# Patient Record
Sex: Female | Born: 1975 | Race: White | Hispanic: No | Marital: Married | Smoking: Never smoker
Health system: Southern US, Community
[De-identification: ages and names within clinical notes are randomized; demographics above are authoritative.]

## PROBLEM LIST (undated history)

## (undated) DIAGNOSIS — N809 Endometriosis, unspecified: Secondary | ICD-10-CM

## (undated) DIAGNOSIS — E079 Disorder of thyroid, unspecified: Secondary | ICD-10-CM

---

## 2015-04-07 DIAGNOSIS — N83201 Unspecified ovarian cyst, right side: Secondary | ICD-10-CM | POA: Insufficient documentation

## 2015-06-24 DIAGNOSIS — N80109 Endometriosis of ovary, unspecified side, unspecified depth: Secondary | ICD-10-CM | POA: Insufficient documentation

## 2015-08-15 DIAGNOSIS — T8149XA Infection following a procedure, other surgical site, initial encounter: Secondary | ICD-10-CM | POA: Insufficient documentation

## 2020-01-18 ENCOUNTER — Other Ambulatory Visit: Payer: Self-pay

## 2020-01-18 ENCOUNTER — Ambulatory Visit
Admission: EM | Admit: 2020-01-18 | Discharge: 2020-01-18 | Disposition: A | Discharge status: Home / Self Care | Payer: BC Managed Care – PPO | Attending: Emergency Medicine | Admitting: Emergency Medicine

## 2020-01-18 DIAGNOSIS — R1011 Right upper quadrant pain: Secondary | ICD-10-CM

## 2020-01-18 HISTORY — DX: Endometriosis, unspecified: N80.9

## 2020-01-18 HISTORY — DX: Disorder of thyroid, unspecified: E07.9

## 2020-01-18 LAB — LIPASE, BLOOD: Lipase: 30 U/L (ref 11–51)

## 2020-01-18 LAB — CBC WITH DIFFERENTIAL/PLATELET
Abs Immature Granulocytes: 0.03 10*3/uL (ref 0.00–0.07)
Basophils Absolute: 0.1 10*3/uL (ref 0.0–0.1)
Basophils Relative: 1 %
Eosinophils Absolute: 0.5 10*3/uL (ref 0.0–0.5)
Eosinophils Relative: 5 %
HCT: 36.8 % (ref 36.0–46.0)
Hemoglobin: 12.9 g/dL (ref 12.0–15.0)
Immature Granulocytes: 0 %
Lymphocytes Relative: 26 %
Lymphs Abs: 2.9 10*3/uL (ref 0.7–4.0)
MCH: 30.9 pg (ref 26.0–34.0)
MCHC: 35.1 g/dL (ref 30.0–36.0)
MCV: 88 fL (ref 80.0–100.0)
Monocytes Absolute: 0.9 10*3/uL (ref 0.1–1.0)
Monocytes Relative: 8 %
Neutro Abs: 6.6 10*3/uL (ref 1.7–7.7)
Neutrophils Relative %: 60 %
Platelets: 287 10*3/uL (ref 150–400)
RBC: 4.18 MIL/uL (ref 3.87–5.11)
RDW: 13 % (ref 11.5–15.5)
WBC: 11 10*3/uL — ABNORMAL HIGH (ref 4.0–10.5)
nRBC: 0 % (ref 0.0–0.2)

## 2020-01-18 LAB — URINALYSIS, COMPLETE (UACMP) WITH MICROSCOPIC
Bacteria, UA: NONE SEEN
Bilirubin Urine: NEGATIVE
Glucose, UA: NEGATIVE mg/dL
Leukocytes,Ua: NEGATIVE
Nitrite: NEGATIVE
Protein, ur: NEGATIVE mg/dL
RBC / HPF: NONE SEEN RBC/hpf (ref 0–5)
Specific Gravity, Urine: 1.01 (ref 1.005–1.030)
WBC, UA: NONE SEEN WBC/hpf (ref 0–5)
pH: 6.5 (ref 5.0–8.0)

## 2020-01-18 LAB — COMPREHENSIVE METABOLIC PANEL
ALT: 19 U/L (ref 0–44)
AST: 20 U/L (ref 15–41)
Albumin: 4.6 g/dL (ref 3.5–5.0)
Alkaline Phosphatase: 68 U/L (ref 38–126)
Anion gap: 13 (ref 5–15)
BUN: 14 mg/dL (ref 6–20)
CO2: 24 mmol/L (ref 22–32)
Calcium: 9.4 mg/dL (ref 8.9–10.3)
Chloride: 100 mmol/L (ref 98–111)
Creatinine, Ser: 0.72 mg/dL (ref 0.44–1.00)
GFR calc Af Amer: >60 mL/min (ref >60)
GFR calc non Af Amer: >60 mL/min (ref >60)
Glucose, Bld: 88 mg/dL (ref 70–99)
Potassium: 4.2 mmol/L (ref 3.5–5.1)
Sodium: 137 mmol/L (ref 135–145)
Total Bilirubin: 0.3 mg/dL (ref 0.3–1.2)
Total Protein: 7.6 g/dL (ref 6.5–8.1)

## 2020-01-18 NOTE — ED Triage Notes (Signed)
Pt presents with c/o RUQ pain since the weekend. She reports the area feels swollen and the pain has started radiating to her back and right flank. She denies any symptoms after eating such as n/v/d. She does report some increased indigestion over the past month.

## 2020-01-18 NOTE — ED Provider Notes (Signed)
[facility] ____________________________________________  Time seen: Approximately 7:31 PM  I have reviewed the triage vital signs and the nursing notes.   HISTORY  Chief Complaint Abdominal Pain (RUQ)  HPI Courtney Garrison is a 44 y.o. female presenting for evaluation of right upper quadrant abdominal pain.  Patient reports for the last week she has had right upper abdominal discomfort that is more of a bloated discomfort feeling.  States yesterday the pain increased more and intermittently feeling it going into her right back. Pain intermittent.  Denies any other pain radiation.  Denies accompanying nausea, vomiting or diarrhea.  Does not seem to be aggravated by food intake.  States that she overall eats a pretty clean diet at baseline.  No accompanying fevers.  Denies dysuria or vaginal complaints.  Further states since end of last summer she has occasionally had some right upper abdominal discomfort in which she thought was acid reflux related, but never had any persistent pain.  Denies cough, congestion, chest pain, shortness of breath or recent sickness.  Denies other aggravating or alleviating factors.  NO PCP  Patient's last menstrual period was 01/01/2020 (approximate). Denies pregnancy.   Has appt with OBGYN on Feb 3 for same complaint.    Past Medical History:  Diagnosis Date  . Endometriosis   . Thyroid disease     There are no problems to display for this patient.   History reviewed. No pertinent surgical history.   No current facility-administered medications for this encounter.  Current Outpatient Medications:  .  calcium-vitamin D (OSCAL WITH D) 500-200 MG-UNIT tablet, Take 1 tablet by mouth., Disp: , Rfl:  .  hydrochlorothiazide (MICROZIDE) 12.5 MG capsule, Take 12.5 mg by mouth daily., Disp: , Rfl:  .  OVER THE COUNTER MEDICATION, IMMUNITY BLEND/vitamins, Disp: , Rfl:  .  SYNTHROID 125 MCG tablet, Take 125 mcg by mouth daily., Disp: , Rfl:    Allergies Patient has no known allergies.  Family History  Problem Relation Age of Onset  . Healthy Mother   . Healthy Father     Social History Social History   Tobacco Use  . Smoking status: Never Smoker  . Smokeless tobacco: Never Used  Substance Use Topics  . Alcohol use: Not Currently  . Drug use: Not on file    Review of Systems Constitutional: No fever ENT: No sore throat. Cardiovascular: Denies chest pain. Respiratory: Denies shortness of breath. Gastrointestinal: Positive abdominal pain.  No nausea, no vomiting.  No diarrhea.  No constipation. LBM today.  Genitourinary: Negative for dysuria. Musculoskeletal: Positive intermittent right back pain. Skin: Negative for rash.   ____________________________________________   PHYSICAL EXAM:  VITAL SIGNS: ED Triage Vitals  Enc Vitals Group     BP 01/18/20 1855 121/85     Pulse Rate 01/18/20 1855 78     Resp -- 18     Temp 01/18/20 1855 98.4 F (36.9 C)     Temp Source 01/18/20 1855 Oral     SpO2 01/18/20 1855 100 %     Weight 01/18/20 1850 168 lb (76.2 kg)     Height 01/18/20 1850 5\' 2"  (1.575 m)     Head Circumference --      Peak Flow --      Pain Score 01/18/20 1849 6     Pain Loc --      Pain Edu? --      Excl. in GC? --     Constitutional: Alert and oriented. Well appearing and in no  acute distress. Eyes: Conjunctivae are normal.  ENT      Head: Normocephalic and atraumatic. Cardiovascular: Normal rate, regular rhythm. Grossly normal heart sounds.  Good peripheral circulation. Respiratory: Normal respiratory effort without tachypnea nor retractions. Breath sounds are clear and equal bilaterally. No wheezes, rales, rhonchi. Gastrointestinal:No distention. Normal Bowel sounds.  Mild to moderate right upper quadrant abdominal tenderness.  Nonguarding.  Abdomen otherwise soft and nontender.  No CVA tenderness. Musculoskeletal: Gait.  No flank tenderness. Neurologic:  Normal speech and language.  Speech is normal. No gait instability.  Skin:  Skin is warm, dry and intact. No rash noted. Psychiatric: Mood and affect are normal. Speech and behavior are normal. Patient exhibits appropriate insight and judgment   ___________________________________________   LABS (all labs ordered are listed, but only abnormal results are displayed)  Labs Reviewed  URINALYSIS, COMPLETE (UACMP) WITH MICROSCOPIC - Abnormal; Notable for the following components:      Result Value   Hgb urine dipstick TRACE (*)    Ketones, ur TRACE (*)    All other components within normal limits  CBC WITH DIFFERENTIAL/PLATELET - Abnormal; Notable for the following components:   WBC 11.0 (*)    All other components within normal limits  COMPREHENSIVE METABOLIC PANEL  LIPASE, BLOOD   ____________________________________________   PROCEDURES Procedures    INITIAL IMPRESSION / ASSESSMENT AND PLAN / ED COURSE  Pertinent labs & imaging results that were available during my care of the patient were reviewed by me and considered in my medical decision making (see chart for details).  Well-appearing patient.  No acute distress.  Right upper quadrant abdominal pain.  Discussed multiple differentials including gallstone, cholecystitis, pancreatitis, esophagitis, GERD.  Suspect gallbladder etiology.  No ultrasound availability at this facility at this time.  Will evaluate CBC, CMP and lipase.  Labs reviewed, as above, overall unremarkable.  Patient does not have a current active PCP.  Follows with her OB/GYN, counseled to follow-up closely as well as information for GI given.  Recommend follow-up with GI.  Discussed worsening symptoms proceed directly to ER. Discussed follow up and return parameters including no resolution or any worsening concerns. Patient verbalized understanding and agreed to plan.   ____________________________________________   FINAL CLINICAL IMPRESSION(S) / ED DIAGNOSES  Final diagnoses:  Right  upper quadrant abdominal pain     ED Discharge Orders    None       Note: This dictation was prepared with Dragon dictation along with smaller phrase technology. Any transcriptional errors that result from this process are unintentional.         Marylene Land, NP 01/18/20 2012

## 2020-01-18 NOTE — Discharge Instructions (Signed)
Gallbladder diet as discussed. Monitor.   Follow-up with your OB/GYN and gastroenterology as discussed.  Return to urgent care as needed.  Proceed erectly to emergency room for increased pain or worsening complaints.

## 2020-01-23 ENCOUNTER — Other Ambulatory Visit: Payer: Self-pay

## 2020-01-23 DIAGNOSIS — N879 Dysplasia of cervix uteri, unspecified: Secondary | ICD-10-CM | POA: Insufficient documentation

## 2020-01-24 ENCOUNTER — Other Ambulatory Visit: Payer: Self-pay

## 2020-01-24 ENCOUNTER — Encounter: Payer: Self-pay | Admitting: Gastroenterology

## 2020-01-24 ENCOUNTER — Ambulatory Visit (INDEPENDENT_AMBULATORY_CARE_PROVIDER_SITE_OTHER): Payer: BC Managed Care – PPO | Admitting: Gastroenterology

## 2020-01-24 VITALS — BP 115/78 | HR 79 | Temp 98.6°F | Ht 62.0 in | Wt 176.0 lb

## 2020-01-24 DIAGNOSIS — R1011 Right upper quadrant pain: Secondary | ICD-10-CM | POA: Diagnosis not present

## 2020-01-24 NOTE — Progress Notes (Signed)
Gastroenterology Consultation  Referring Provider:     No ref. provider found Primary Care Physician:  Glendon Axe, MD Primary Gastroenterologist:  Dr. Allen Norris     Reason for Consultation:     Right side abdominal pain        HPI:   Courtney Garrison is a 44 y.o. y/o female referred for consultation & management of right-sided abdominal pain by Dr. Glendon Axe, MD.  This patient comes in today after being seen in urgent care.  The patient was told in urgent care that she may be having right upper quadrant pain caused by a bad gallbladder.  The patient states that she has family members with gallbladder disease and has had their gallbladder out.  The patient denies that any certain foods will make her symptoms any better or worse.  She also denies any nausea or vomiting associated with the right side abdominal pain.  On further discussing the patient's symptoms she states that she was doing a lot of rowing on a rowing machine when she first noticed the pain to occur.  There is no abnormal liver enzymes when she was seen in urgent care.  She was sent to me for evaluation for possible gallbladder disease.  There is no report of any black stools or bloody stools.  She also denies any unexplained weight loss.  Past Medical History:  Diagnosis Date  . Endometriosis   . Thyroid disease     History reviewed. No pertinent surgical history.  Prior to Admission medications   Medication Sig Start Date End Date Taking? Authorizing Provider  calcium-vitamin D (OSCAL WITH D) 500-200 MG-UNIT tablet Take 1 tablet by mouth.   Yes [provider]  hydrochlorothiazide (MICROZIDE) 12.5 MG capsule Take 12.5 mg by mouth daily. 09/27/19  Yes [provider]  OVER THE COUNTER MEDICATION IMMUNITY BLEND/vitamins   Yes [provider]  SYNTHROID 125 MCG tablet Take 125 mcg by mouth daily. 10/31/19  Yes [provider]  valACYclovir (VALTREX) 500 MG tablet TAKE 1 TABLET (500 MG  TOTAL) BY MOUTH 2 (TWO) TIMES DAILY FOR 3 DAYS FOR OUTBREAKS. 09/15/19  Yes [provider]    Family History  Problem Relation Age of Onset  . Healthy Mother   . Healthy Father      Social History   Tobacco Use  . Smoking status: Never Smoker  . Smokeless tobacco: Never Used  Substance Use Topics  . Alcohol use: Not Currently  . Drug use: Not on file    Allergies as of 01/24/2020  . (No Known Allergies)    Review of Systems:    All systems reviewed and negative except where noted in HPI.   Physical Exam:  BP 115/78   Pulse 79   Temp 98.6 F (37 C) (Temporal)   Ht 5\' 2"  (1.575 m)   Wt 176 lb (79.8 kg)   LMP 01/01/2020 (Approximate)   BMI 32.19 kg/m  Patient's last menstrual period was 01/01/2020 (approximate). General:   Alert,  Well-developed, well-nourished, pleasant and cooperative in NAD Head:  Normocephalic and atraumatic. Eyes:  Sclera clear, no icterus.   Conjunctiva pink. Ears:  Normal auditory acuity. Neck:  Supple; no masses or thyromegaly. Lungs:  Respirations even and unlabored.  Clear throughout to auscultation.   No wheezes, crackles, or rhonchi. No acute distress. Heart:  Regular rate and rhythm; no murmurs, clicks, rubs, or gallops. Abdomen:  Normal bowel sounds.  No bruits.  Soft, mild right upper quadrant tenderness  to deep palpation.  Mild non-distended without masses, hepatosplenomegaly or hernias noted.  No guarding or rebound tenderness.  Negative Carnett sign.   Rectal:  Deferred.  Pulses:  Normal pulses noted. Extremities:  No clubbing or edema.  No cyanosis. Neurologic:  Alert and oriented x3;  grossly normal neurologically. Skin:  Intact without significant lesions or rashes.  No jaundice. Lymph Nodes:  No significant cervical adenopathy. Psych:  Alert and cooperative. Normal mood and affect.  Imaging Studies: No results found.  Assessment and Plan:   Courtney Garrison is a 44 y.o. y/o female who comes in today with a history of  right upper quadrant pain.  She states it radiates to her back but not associated with any greasy food or fatty food.  The patient also denies any nausea vomiting fevers or chills.  The patient right upper quadrant pain may be musculoskeletal versus gallbladder disease.  The patient risk factors for gallbladder disease include her age her BMI and her gender.  The patient will be set up for a right upper quadrant ultrasound with a gallbladder emptying study.  The patient has been explained the plan and agrees with it.    Midge Minium, MD. Clementeen Graham    Note: This dictation was prepared with Dragon dictation along with smaller phrase technology. Any transcriptional errors that result from this process are unintentional.

## 2020-01-26 ENCOUNTER — Other Ambulatory Visit: Payer: Self-pay

## 2020-01-26 DIAGNOSIS — R1011 Right upper quadrant pain: Secondary | ICD-10-CM

## 2020-02-06 ENCOUNTER — Ambulatory Visit: Payer: BC Managed Care – PPO

## 2020-02-06 ENCOUNTER — Other Ambulatory Visit: Payer: BC Managed Care – PPO

## 2020-02-09 ENCOUNTER — Ambulatory Visit
Admission: RE | Admit: 2020-02-09 | Discharge: 2020-02-09 | Disposition: A | Discharge status: Home / Self Care | Payer: BC Managed Care – PPO | Source: Ambulatory Visit | Attending: Gastroenterology | Admitting: Gastroenterology

## 2020-02-09 ENCOUNTER — Ambulatory Visit: Payer: BC Managed Care – PPO | Admitting: Gastroenterology

## 2020-02-09 ENCOUNTER — Other Ambulatory Visit: Payer: Self-pay

## 2020-02-09 ENCOUNTER — Encounter
Admission: RE | Admit: 2020-02-09 | Discharge: 2020-02-09 | Disposition: A | Discharge status: Home / Self Care | Payer: BC Managed Care – PPO | Source: Ambulatory Visit | Attending: Gastroenterology | Admitting: Gastroenterology

## 2020-02-09 DIAGNOSIS — R1011 Right upper quadrant pain: Secondary | ICD-10-CM | POA: Insufficient documentation

## 2020-02-09 MED ORDER — TECHNETIUM TC 99M MEBROFENIN IV KIT
5.0000 | PACK | Freq: Once | INTRAVENOUS | Status: AC | PRN
  Administered 2020-02-09: 5.2 via INTRAVENOUS

## 2020-02-12 ENCOUNTER — Telehealth: Payer: Self-pay

## 2020-02-12 NOTE — Telephone Encounter (Signed)
Pt notified of results via mychart.  

## 2020-02-12 NOTE — Telephone Encounter (Signed)
-----   Message from Midge Minium, MD sent at 02/10/2020  9:48 AM EST ----- Let the patient know that I am glad to inform her that her gallbladder emptying study showed her gallbladder is ejecting its contents in a normal fashion and her study was negative for any gallbladder disease.

## 2020-02-12 NOTE — Telephone Encounter (Signed)
-----   Message from Midge Minium, MD sent at 02/10/2020  9:43 AM EST ----- Let the patient know that the abdominal ultrasound did not show any gallstones or any other issues.

## 2020-02-28 ENCOUNTER — Other Ambulatory Visit
Admission: RE | Admit: 2020-02-28 | Discharge: 2020-02-28 | Disposition: A | Discharge status: Home / Self Care | Payer: BC Managed Care – PPO | Source: Ambulatory Visit | Attending: Gastroenterology | Admitting: Gastroenterology

## 2020-02-28 ENCOUNTER — Other Ambulatory Visit: Payer: Self-pay

## 2020-02-28 DIAGNOSIS — Z01812 Encounter for preprocedural laboratory examination: Secondary | ICD-10-CM | POA: Insufficient documentation

## 2020-02-28 DIAGNOSIS — Z20822 Contact with and (suspected) exposure to covid-19: Secondary | ICD-10-CM | POA: Insufficient documentation

## 2020-02-28 LAB — SARS CORONAVIRUS 2 (TAT 6-24 HRS): SARS Coronavirus 2: NEGATIVE

## 2020-02-29 ENCOUNTER — Encounter: Payer: Self-pay | Admitting: Gastroenterology

## 2020-03-01 ENCOUNTER — Ambulatory Visit: Payer: BC Managed Care – PPO | Admitting: Registered Nurse

## 2020-03-01 ENCOUNTER — Encounter
Admission: RE | Disposition: A | Discharge status: Home / Self Care | Payer: Self-pay | Source: Home / Self Care | Attending: Gastroenterology

## 2020-03-01 ENCOUNTER — Ambulatory Visit
Admission: RE | Admit: 2020-03-01 | Discharge: 2020-03-01 | Disposition: A | Discharge status: Home / Self Care | Payer: BC Managed Care – PPO | Attending: Gastroenterology | Admitting: Gastroenterology

## 2020-03-01 ENCOUNTER — Other Ambulatory Visit: Payer: Self-pay

## 2020-03-01 DIAGNOSIS — K222 Esophageal obstruction: Secondary | ICD-10-CM

## 2020-03-01 DIAGNOSIS — R1319 Other dysphagia: Secondary | ICD-10-CM

## 2020-03-01 DIAGNOSIS — E079 Disorder of thyroid, unspecified: Secondary | ICD-10-CM | POA: Insufficient documentation

## 2020-03-01 DIAGNOSIS — R1310 Dysphagia, unspecified: Secondary | ICD-10-CM

## 2020-03-01 DIAGNOSIS — K228 Other specified diseases of esophagus: Secondary | ICD-10-CM | POA: Insufficient documentation

## 2020-03-01 DIAGNOSIS — R1011 Right upper quadrant pain: Secondary | ICD-10-CM | POA: Diagnosis not present

## 2020-03-01 DIAGNOSIS — Z79899 Other long term (current) drug therapy: Secondary | ICD-10-CM | POA: Diagnosis not present

## 2020-03-01 DIAGNOSIS — K449 Diaphragmatic hernia without obstruction or gangrene: Secondary | ICD-10-CM | POA: Diagnosis not present

## 2020-03-01 HISTORY — PX: ESOPHAGOGASTRODUODENOSCOPY (EGD) WITH PROPOFOL: SHX5813

## 2020-03-01 LAB — POCT PREGNANCY, URINE: Preg Test, Ur: NEGATIVE

## 2020-03-01 SURGERY — ESOPHAGOGASTRODUODENOSCOPY (EGD) WITH PROPOFOL
Anesthesia: General

## 2020-03-01 MED ORDER — LIDOCAINE HCL (PF) 2 % IJ SOLN
INTRAMUSCULAR | Status: AC
  Filled 2020-03-01: qty 10

## 2020-03-01 MED ORDER — SODIUM CHLORIDE 0.9 % IV SOLN
INTRAVENOUS | Status: DC
  Administered 2020-03-01: 07:00:00 1000 mL via INTRAVENOUS

## 2020-03-01 MED ORDER — PROPOFOL 10 MG/ML IV BOLUS
INTRAVENOUS | Status: DC | PRN
  Administered 2020-03-01: 30 mg via INTRAVENOUS
  Administered 2020-03-01: 40 mg via INTRAVENOUS
  Administered 2020-03-01: 100 mg via INTRAVENOUS

## 2020-03-01 MED ORDER — EPHEDRINE SULFATE 50 MG/ML IJ SOLN
INTRAMUSCULAR | Status: AC
  Filled 2020-03-01: qty 1

## 2020-03-01 MED ORDER — LIDOCAINE HCL (CARDIAC) PF 100 MG/5ML IV SOSY
PREFILLED_SYRINGE | INTRAVENOUS | Status: DC | PRN
  Administered 2020-03-01: 100 mg via INTRAVENOUS

## 2020-03-01 MED ORDER — PROPOFOL 10 MG/ML IV BOLUS
INTRAVENOUS | Status: AC
  Filled 2020-03-01: qty 20

## 2020-03-01 MED ORDER — PROPOFOL 500 MG/50ML IV EMUL
INTRAVENOUS | Status: AC
  Filled 2020-03-01: qty 200

## 2020-03-01 NOTE — Transfer of Care (Signed)
Immediate Anesthesia Transfer of Care Note  Patient: Courtney Garrison  Procedure(s) Performed: ESOPHAGOGASTRODUODENOSCOPY (EGD) WITH PROPOFOL (N/A )  Patient Location: PACU  Anesthesia Type:General  Level of Consciousness: sedated  Airway & Oxygen Therapy: Patient Spontanous Breathing  Post-op Assessment: Report given to RN and Post -op Vital signs reviewed and stable  Post vital signs: Reviewed and stable  Last Vitals:  Vitals Value Taken Time  BP 99/64 03/01/20 0819  Temp    Pulse 84 03/01/20 0822  Resp 13 03/01/20 0822  SpO2 98 % 03/01/20 0822  Vitals shown include unvalidated device data.  Last Pain:  Vitals:   03/01/20 0818  TempSrc: Temporal  PainSc: 0-No pain         Complications: No apparent anesthesia complications

## 2020-03-01 NOTE — Anesthesia Preprocedure Evaluation (Signed)
Anesthesia Evaluation  Patient identified by MRN, date of birth, ID band Patient awake    Reviewed: Allergy & Precautions, H&P , NPO status , Patient's Chart, lab work & pertinent test results, reviewed documented beta blocker date and time   Airway Mallampati: II   Neck ROM: full    Dental  (+) Poor Dentition   Pulmonary neg pulmonary ROS,    Pulmonary exam normal        Cardiovascular Exercise Tolerance: Good negative cardio ROS Normal cardiovascular exam Rhythm:regular Rate:Normal     Neuro/Psych negative neurological ROS  negative psych ROS   GI/Hepatic negative GI ROS, Neg liver ROS,   Endo/Other  negative endocrine ROS  Renal/GU negative Renal ROS  negative genitourinary   Musculoskeletal   Abdominal   Peds  Hematology negative hematology ROS (+)   Anesthesia Other Findings Past Medical History: No date: Endometriosis No date: Thyroid disease History reviewed. No pertinent surgical history. BMI    Body Mass Index: 32.12 kg/m     Reproductive/Obstetrics negative OB ROS                             Anesthesia Physical Anesthesia Plan  ASA: II  Anesthesia Plan: General   Post-op Pain Management:    Induction:   PONV Risk Score and Plan:   Airway Management Planned:   Additional Equipment:   Intra-op Plan:   Post-operative Plan:   Informed Consent: I have reviewed the patients History and Physical, chart, labs and discussed the procedure including the risks, benefits and alternatives for the proposed anesthesia with the patient or authorized representative who has indicated his/her understanding and acceptance.     Dental Advisory Given  Plan Discussed with: CRNA  Anesthesia Plan Comments:         Anesthesia Quick Evaluation

## 2020-03-01 NOTE — Op Note (Addendum)
[facility] Gastroenterology Patient Name: Courtney Garrison Procedure Date: 03/01/2020 8:03 AM MRN: [id number] Account #: [id number] Date of Birth: 1976/08/05 Admit Type: Outpatient Age: 44 Room: [facility] ENDO ROOM 4 Gender: Female Note Status: Finalized Procedure:             Upper GI endoscopy Indications:           Dysphagia Providers:             Midge Minium MD, MD Referring MD:          Erma Heritage. Maclaurin (Referring MD) Medicines:             Propofol per Anesthesia Complications:         Minor bleeding - stopped spontaneously Procedure:             Pre-Anesthesia Assessment:                        - Prior to the procedure, a History and Physical was                         performed, and patient medications and allergies were                         reviewed. The patient's tolerance of previous                         anesthesia was also reviewed. The risks and benefits                         of the procedure and the sedation options and risks                         were discussed with the patient. All questions were                         answered, and informed consent was obtained. Prior                         Anticoagulants: The patient has taken no previous                         anticoagulant or antiplatelet agents. ASA Grade                         Assessment: II - A patient with mild systemic disease.                         After reviewing the risks and benefits, the patient                         was deemed in satisfactory condition to undergo the                         procedure.                        After obtaining informed consent, the endoscope was  passed under direct vision. Throughout the procedure,                         the patient's blood pressure, pulse, and oxygen                         saturations were monitored continuously. The Endoscope                         was introduced through the mouth, and advanced to  the                         second part of duodenum. The upper GI endoscopy was                         accomplished without difficulty. The patient tolerated                         the procedure well. Findings:      A small hiatal hernia was present.      One benign-appearing, intrinsic mild stenosis was found in the distal       esophagus. The stenosis was traversed. A TTS dilator was passed through       the scope. Dilation with a 15-16.5-18 mm balloon dilator was performed       to 18 mm. The dilation site was examined following endoscope reinsertion       and showed complete resolution of luminal narrowing.      The stomach was normal.      The examined duodenum was normal.      Two biopsies were obtained with cold forceps for histology in the middle       third of the esophagus. Impression:            - Small hiatal hernia.                        - Benign-appearing esophageal stenosis. Dilated.                        - Normal stomach.                        - Normal examined duodenum.                        - Biopsy performed in the middle third of the                         esophagus. Recommendation:        - Discharge patient to home.                        - Clear liquid diet today. Procedure Code(s):     --- Professional ---                        [phone], Esophagogastroduodenoscopy, flexible,                         transoral; with transendoscopic balloon dilation of  esophagus (less than 30 mm diameter)                        43239, 59, Esophagogastroduodenoscopy, flexible,                         transoral; with biopsy, single or multiple Diagnosis Code(s):     --- Professional ---                        R13.10, Dysphagia, unspecified                        K22.2, Esophageal obstruction CPT copyright 2019 American Medical Association. All rights reserved. The codes documented in this report are preliminary and upon coder review may  be revised to  meet current compliance requirements. Midge Minium MD, MD 03/01/2020 8:16:55 AM This report has been signed electronically. Number of Addenda: 0 Note Initiated On: 03/01/2020 8:03 AM Estimated Blood Loss:  Estimated blood loss was minimal.      [facility]

## 2020-03-01 NOTE — H&P (Signed)
Midge Minium, MD [facility] [address], [address] [place], [place] [address] Phone:[phone] Fax : [phone]  Primary Care Physician:  Caleen Essex, MD Primary Gastroenterologist:  Dr. Servando Snare  Pre-Procedure History & Physical: HPI:  Courtney Garrison is a 44 y.o. female is here for an endoscopy.   Past Medical History:  Diagnosis Date  . Endometriosis   . Thyroid disease     History reviewed. No pertinent surgical history.  Prior to Admission medications   Medication Sig Start Date End Date Taking? Authorizing Provider  calcium-vitamin D (OSCAL WITH D) 500-200 MG-UNIT tablet Take 1 tablet by mouth.   Yes [provider]  hydrochlorothiazide (MICROZIDE) 12.5 MG capsule Take 12.5 mg by mouth daily. 09/27/19  Yes [provider]  OVER THE COUNTER MEDICATION IMMUNITY BLEND/vitamins   Yes [provider]  SYNTHROID 125 MCG tablet Take 125 mcg by mouth daily. 10/31/19  Yes [provider]  valACYclovir (VALTREX) 500 MG tablet TAKE 1 TABLET (500 MG TOTAL) BY MOUTH 2 (TWO) TIMES DAILY FOR 3 DAYS FOR OUTBREAKS. 09/15/19  Yes [provider]    Allergies as of 01/26/2020  . (No Known Allergies)    Family History  Problem Relation Age of Onset  . Healthy Mother   . Healthy Father     Social History   Socioeconomic History  . Marital status: Married    Spouse name: Not on file  . Number of children: Not on file  . Years of education: Not on file  . Highest education level: Not on file  Occupational History  . Not on file  Tobacco Use  . Smoking status: Never Smoker  . Smokeless tobacco: Never Used  Substance and Sexual Activity  . Alcohol use: Not Currently  . Drug use: Not on file  . Sexual activity: Not on file  Other Topics Concern  . Not on file  Social History Narrative  . Not on file   Social Determinants of Health   Financial Resource Strain:   . Difficulty of Paying Living Expenses: Not on file  Food Insecurity:     . Worried About Programme researcher, broadcasting/film/video in the Last Year: Not on file  . Ran Out of Food in the Last Year: Not on file  Transportation Needs:   . Lack of Transportation (Medical): Not on file  . Lack of Transportation (Non-Medical): Not on file  Physical Activity:   . Days of Exercise per Week: Not on file  . Minutes of Exercise per Session: Not on file  Stress:   . Feeling of Stress : Not on file  Social Connections:   . Frequency of Communication with Friends and Family: Not on file  . Frequency of Social Gatherings with Friends and Family: Not on file  . Attends Religious Services: Not on file  . Active Member of Clubs or Organizations: Not on file  . Attends Banker Meetings: Not on file  . Marital Status: Not on file  Intimate Partner Violence:   . Fear of Current or Ex-Partner: Not on file  . Emotionally Abused: Not on file  . Physically Abused: Not on file  . Sexually Abused: Not on file    Review of Systems: See HPI, otherwise negative ROS  Physical Exam: BP 117/80   Pulse 71   Temp (!) 96.1 F (35.6 C) (Temporal)   Resp 16   Ht 5\' 1"  (1.549 m)   Wt 77.1 kg   SpO2 100%   BMI  32.12 kg/m  General:   Alert,  pleasant and cooperative in NAD Head:  Normocephalic and atraumatic. Neck:  Supple; no masses or thyromegaly. Lungs:  Clear throughout to auscultation.    Heart:  Regular rate and rhythm. Abdomen:  Soft, nontender and nondistended. Normal bowel sounds, without guarding, and without rebound.   Neurologic:  Alert and  oriented x4;  grossly normal neurologically.  Impression/Plan: Courtney Garrison is here for an endoscopy to be performed for Abd pain  Risks, benefits, limitations, and alternatives regarding  endoscopy have been reviewed with the patient.  Questions have been answered.  All parties agreeable.   Lucilla Lame, MD  03/01/2020, 8:02 AM

## 2020-03-04 ENCOUNTER — Encounter: Payer: Self-pay | Admitting: *Deleted

## 2020-03-04 LAB — SURGICAL PATHOLOGY

## 2020-03-06 ENCOUNTER — Other Ambulatory Visit: Payer: Self-pay

## 2020-03-06 ENCOUNTER — Telehealth: Payer: Self-pay

## 2020-03-06 MED ORDER — PANTOPRAZOLE SODIUM 40 MG PO TBEC: 40.0000 mg | DELAYED_RELEASE_TABLET | Freq: Every day | ORAL | 2 refills | Status: AC

## 2020-03-06 NOTE — Telephone Encounter (Signed)
Pt notified of results. Per new guidelines for EOE, pt will be started on a PPI first. Pt is not currently taking one. Pt will be contacted in June to see how her symptoms are doing.

## 2020-03-06 NOTE — Telephone Encounter (Signed)
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  The new guidelines  report that this can be treated with just acid suppression with a PPI or fluticasone.  If the patient is not on a PPI then she should be started on wine and see how her symptoms progress.  If she is already on a PPI then she should be started on fluticasone.

## 2020-03-06 NOTE — Anesthesia Postprocedure Evaluation (Signed)
Anesthesia Post Note  Patient: Courtney Garrison  Procedure(s) Performed: ESOPHAGOGASTRODUODENOSCOPY (EGD) WITH PROPOFOL (N/A )  Patient location during evaluation: PACU Anesthesia Type: General Level of consciousness: awake and alert Pain management: pain level controlled Vital Signs Assessment: post-procedure vital signs reviewed and stable Respiratory status: spontaneous breathing, nonlabored ventilation, respiratory function stable and patient connected to nasal cannula oxygen Cardiovascular status: blood pressure returned to baseline and stable Postop Assessment: no apparent nausea or vomiting Anesthetic complications: no     Last Vitals:  Vitals:   03/01/20 0848 03/01/20 0858  BP: 125/82 (!) 121/91  Pulse:    Resp:    Temp:    SpO2:      Last Pain:  Vitals:   03/01/20 0858  TempSrc:   PainSc: 2                  Yevette Edwards

## 2020-05-08 IMAGING — NM NM HEPATO W/GB/PHARM/[PERSON_NAME]
2 series · 12 of 12 positions shown · non-contrast
Comparison: None

CLINICAL DATA: RIGHT upper quadrant abdominal pain, chronic

EXAM:
NUCLEAR MEDICINE HEPATOBILIARY IMAGING WITH GALLBLADDER EF
TECHNIQUE: Sequential images of the abdomen were obtained [DATE] minutes
following intravenous administration of radiopharmaceutical. After
oral ingestion of Ensure, gallbladder ejection fraction was
determined. At 60 min, normal ejection fraction is greater than 33%.
RADIOPHARMACEUTICALS:  5.2 mCi 3c-DDm  Choletec IV

[Series 1000: hepatobiliary scan · 9.59mm/px · 6 of 60 frames shown]
[frame 6/60]
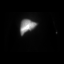
[frame 16/60]
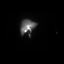
[frame 26/60]
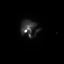
[frame 36/60]
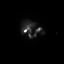
[frame 46/60]
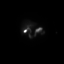
[frame 56/60]
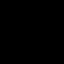

[Series 1000: gallbladder ef · 4.80mm/px · 6 of 120 frames shown]
[frame 11/120]
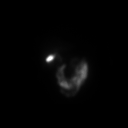
[frame 31/120]
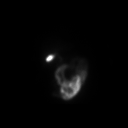
[frame 51/120]
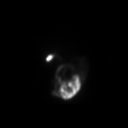
[frame 71/120]
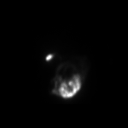
[frame 91/120]
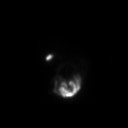
[frame 111/120]
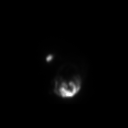

[12 of 12 positions shown; findings below may reference images not displayed]

FINDINGS: Normal tracer extraction from bloodstream indicating normal
hepatocellular function.

Normal excretion of tracer into biliary tree.

Gallbladder visualized at 12 min.

Small bowel visualized at 16 min.

No hepatic retention of tracer.

Subjectively normal emptying of tracer from gallbladder following
fatty meal stimulation.

Calculated gallbladder ejection fraction is 38%, normal.

Patient reported no symptoms following Ensure ingestion.

Normal gallbladder ejection fraction following Ensure ingestion is
greater than 33% at 1 hour.
IMPRESSION: Normal exam.

## 2020-05-08 IMAGING — US US ABDOMEN LIMITED
1 series · 14 of 25 positions shown · non-contrast
Comparison: None.

CLINICAL DATA: Upper abdominal pain

EXAM:
ULTRASOUND ABDOMEN LIMITED RIGHT UPPER QUADRANT

[Series 1: us abdomen limited · 14 of 50 slices shown]
[im 1/50]
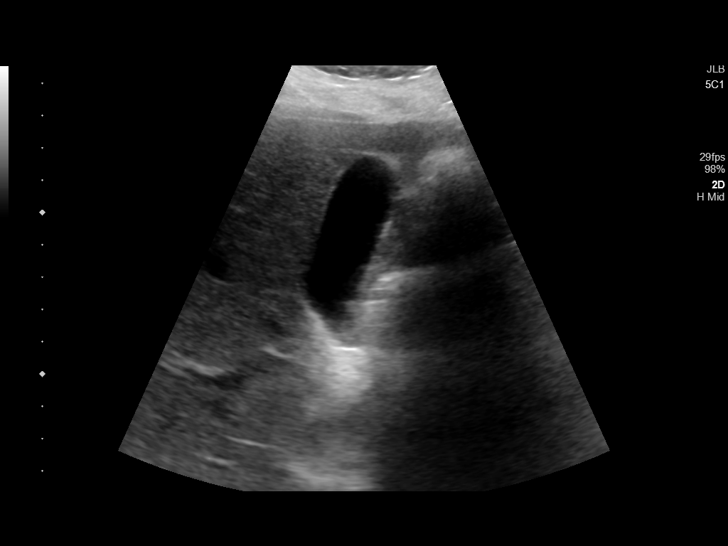
[im 5/50]
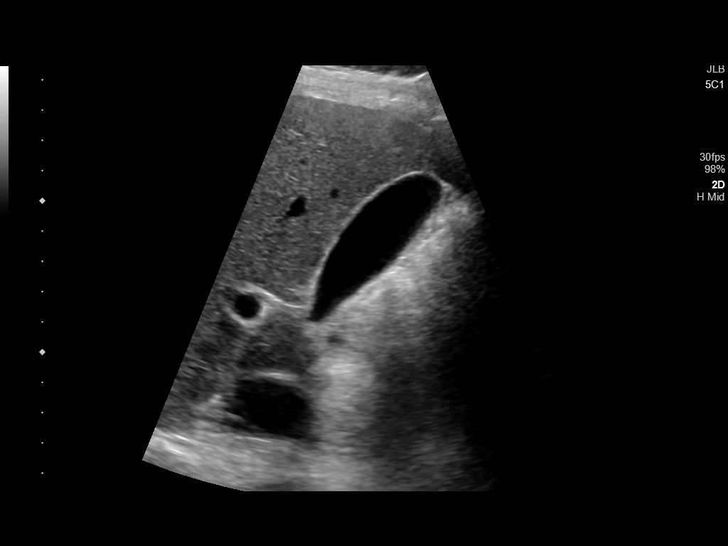
[im 9/50]
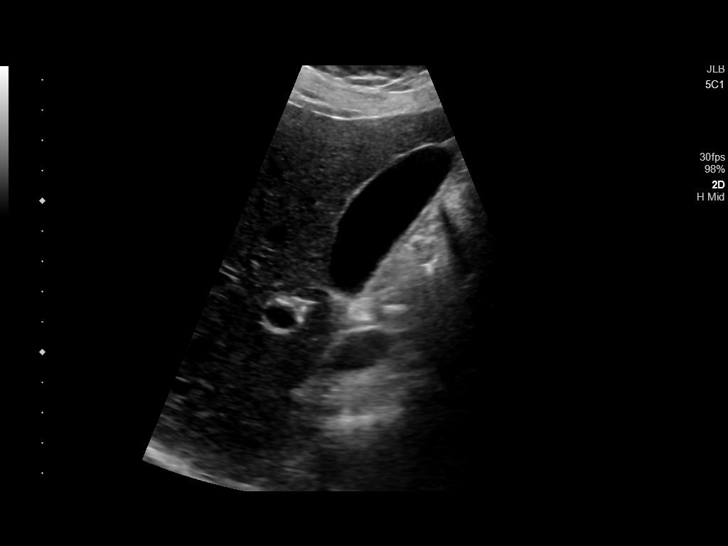
[im 13/50]
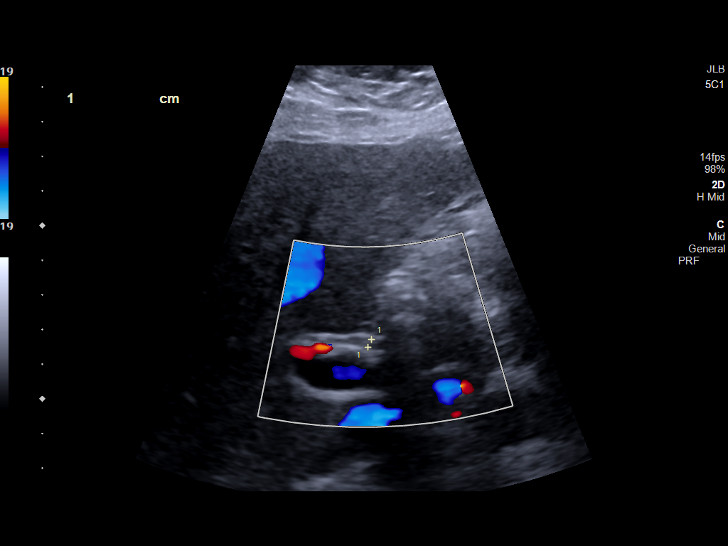
[im 17/50]
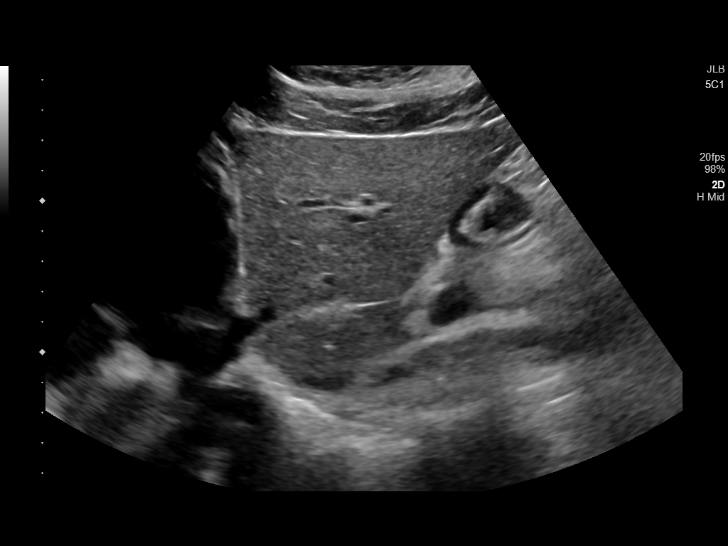
[im 19/50]
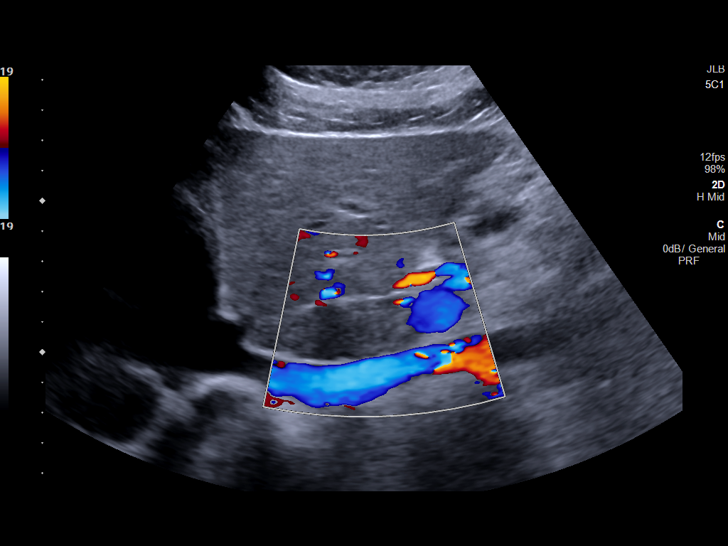
[im 23/50]
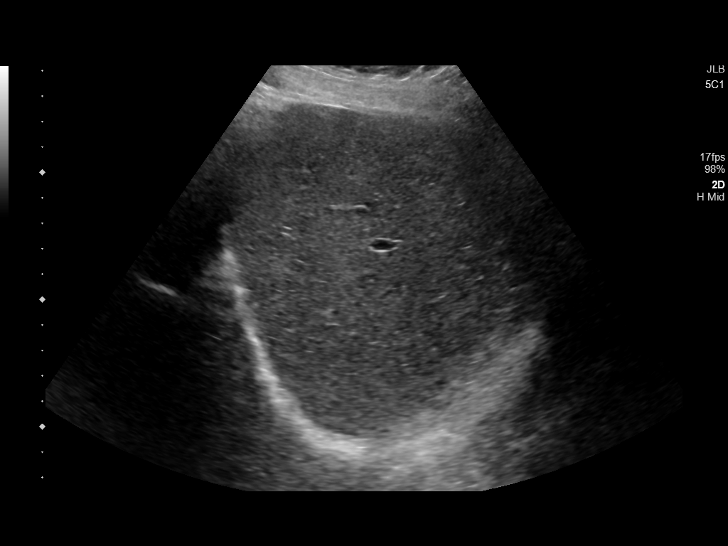
[im 27/50]
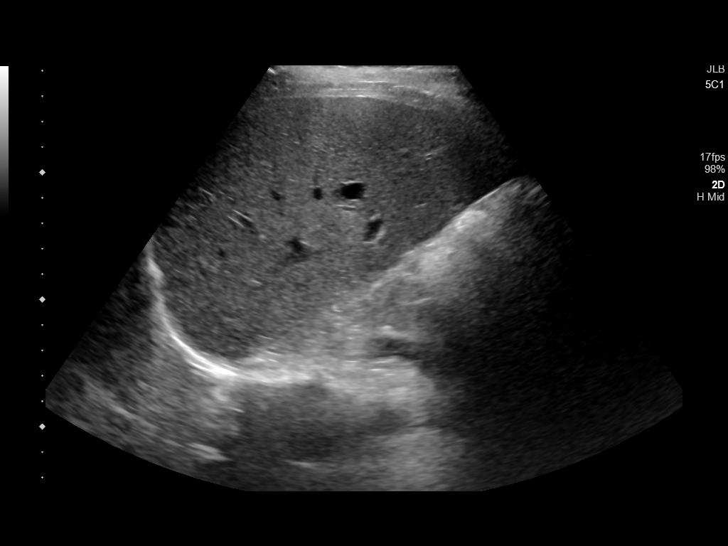
[im 31/50]
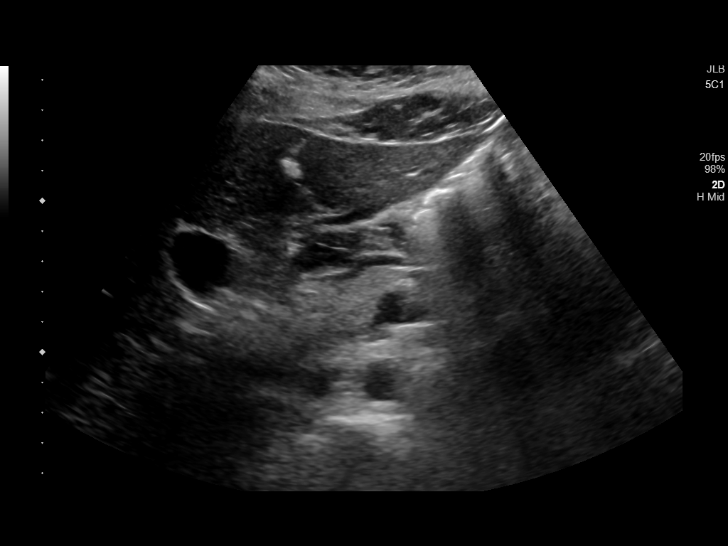
[im 33/50]
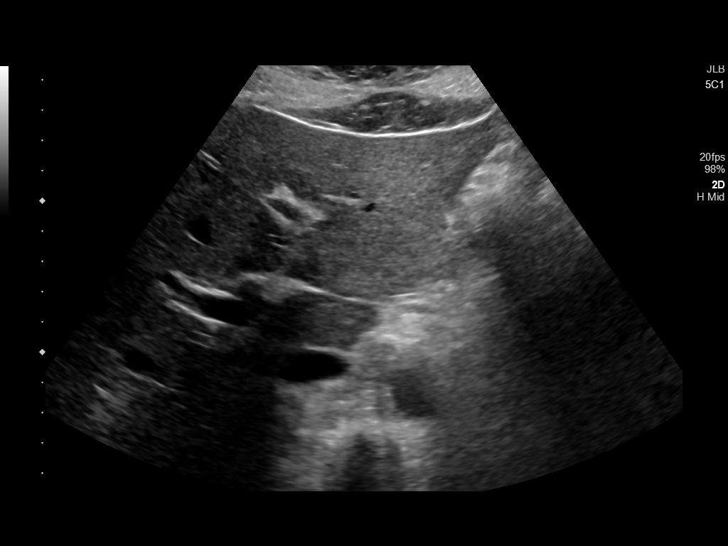
[im 37/50]
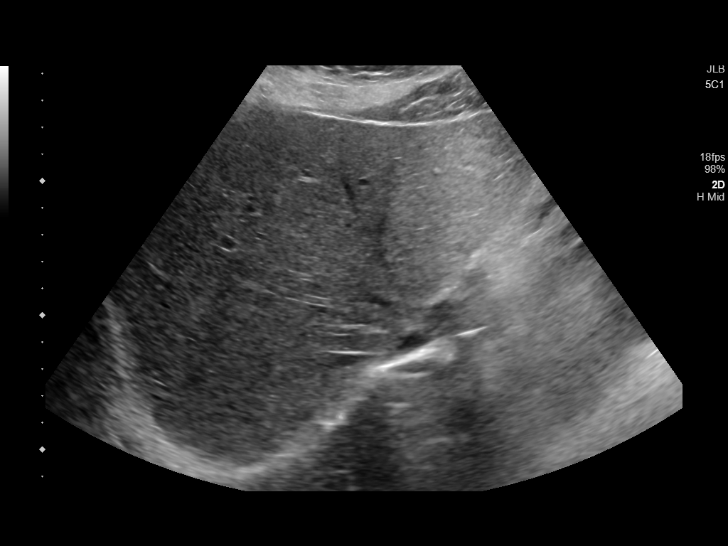
[im 41/50]
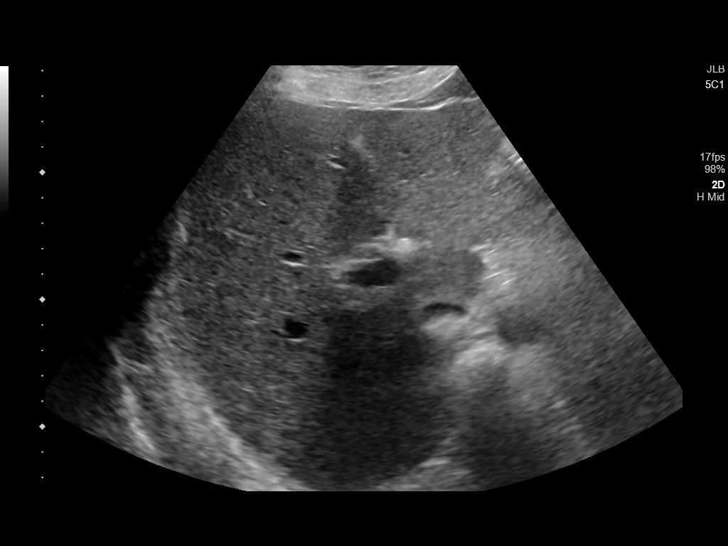
[im 45/50]
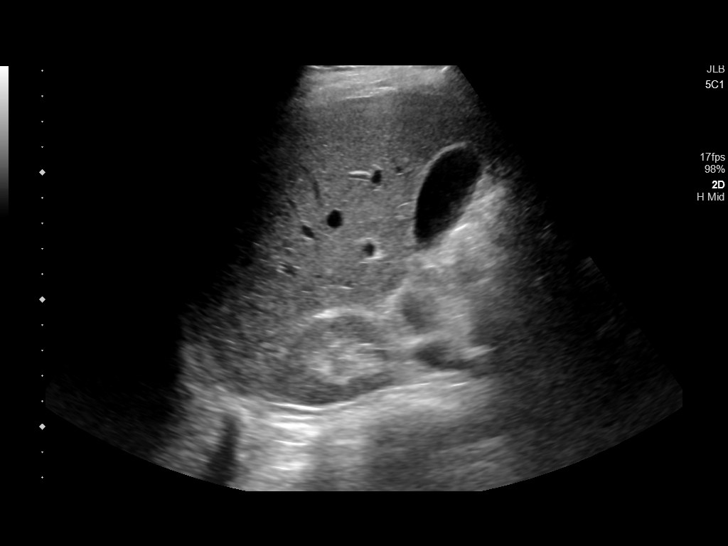
[im 50/50]
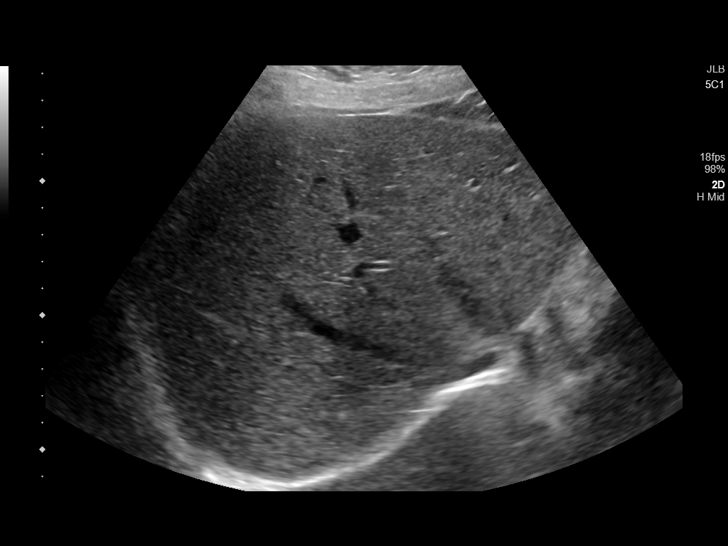

[14 of 25 positions shown; findings below may reference images not displayed]

FINDINGS: Gallbladder:

No gallstones or wall thickening visualized. There is no
pericholecystic fluid. No sonographic Murphy sign noted by
sonographer.

Common bile duct:

Diameter: 3 mm. No intrahepatic or extrahepatic biliary duct
dilatation.

Liver:

No focal lesion identified. Within normal limits in parenchymal
echogenicity. Portal vein is patent on color Doppler imaging with
normal direction of blood flow towards the liver.

Other: None.
IMPRESSION: Study within normal limits.

## 2020-05-26 ENCOUNTER — Other Ambulatory Visit: Payer: Self-pay | Admitting: Gastroenterology

## 2020-05-28 ENCOUNTER — Telehealth: Payer: Self-pay

## 2020-05-28 NOTE — Telephone Encounter (Signed)
-----   Message from Rayann Heman, CMA sent at 03/06/2020 11:36 AM EST ----- Check to see if pt is feeling better after stopping pantoprazole. See result message of 03/06/20.

## 2020-05-28 NOTE — Telephone Encounter (Signed)
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 Pt stated she had read she should only be taking this for 8 weeks so she stopped the medication. I advised pt she was put on this treatment to get rid of the EOE and if she doesn't take it in its entirety as directed by Dr. Servando Snare, her symptoms could return. I advised her she is being followed and monitored by Dr. Servando Snare. She will resume taking it and let me know how she feels once she is done taking all the medication.
